# Patient Record
Sex: Female | Born: 1965 | Race: Black or African American | Hispanic: No | Marital: Single | Smoking: Never smoker
Health system: Southern US, Community
[De-identification: ages and names within clinical notes are randomized; demographics above are authoritative.]

## PROBLEM LIST (undated history)

## (undated) HISTORY — PX: ABDOMINAL SURGERY: SHX537

## (undated) HISTORY — PX: ABDOMINAL HYSTERECTOMY: SHX81

---

## 2005-01-07 ENCOUNTER — Emergency Department (HOSPITAL_COMMUNITY): Admission: EM | Admit: 2005-01-07 | Discharge: 2005-01-07 | Payer: Self-pay | Admitting: Emergency Medicine

## 2018-02-24 ENCOUNTER — Emergency Department: Payer: No Typology Code available for payment source

## 2018-02-24 ENCOUNTER — Other Ambulatory Visit: Payer: Self-pay

## 2018-02-24 ENCOUNTER — Emergency Department
Admission: EM | Admit: 2018-02-24 | Discharge: 2018-02-24 | Disposition: A | Discharge status: Home / Self Care | Payer: No Typology Code available for payment source | Attending: Emergency Medicine | Admitting: Emergency Medicine

## 2018-02-24 ENCOUNTER — Encounter: Payer: Self-pay | Admitting: Emergency Medicine

## 2018-02-24 DIAGNOSIS — Y999 Unspecified external cause status: Secondary | ICD-10-CM | POA: Diagnosis not present

## 2018-02-24 DIAGNOSIS — S0990XA Unspecified injury of head, initial encounter: Secondary | ICD-10-CM | POA: Diagnosis present

## 2018-02-24 DIAGNOSIS — S161XXA Strain of muscle, fascia and tendon at neck level, initial encounter: Secondary | ICD-10-CM | POA: Diagnosis not present

## 2018-02-24 DIAGNOSIS — Y9389 Activity, other specified: Secondary | ICD-10-CM | POA: Insufficient documentation

## 2018-02-24 DIAGNOSIS — Y92410 Unspecified street and highway as the place of occurrence of the external cause: Secondary | ICD-10-CM | POA: Diagnosis not present

## 2018-02-24 DIAGNOSIS — R52 Pain, unspecified: Secondary | ICD-10-CM

## 2018-02-24 LAB — CBC
HCT: 38.1 % (ref 35.0–47.0)
Hemoglobin: 13 g/dL (ref 12.0–16.0)
MCH: 30.2 pg (ref 26.0–34.0)
MCHC: 34.2 g/dL (ref 32.0–36.0)
MCV: 88.3 fL (ref 80.0–100.0)
PLATELETS: 285 10*3/uL (ref 150–440)
RBC: 4.31 MIL/uL (ref 3.80–5.20)
RDW: 13.4 % (ref 11.5–14.5)
WBC: 3.8 10*3/uL (ref 3.6–11.0)

## 2018-02-24 LAB — TROPONIN I: Troponin I: <0.03 ng/mL (ref <0.03)

## 2018-02-24 LAB — BASIC METABOLIC PANEL
Anion gap: 7 (ref 5–15)
BUN: 13 mg/dL (ref 6–20)
CO2: 21 mmol/L — ABNORMAL LOW (ref 22–32)
CREATININE: 0.73 mg/dL (ref 0.44–1.00)
Calcium: 9.3 mg/dL (ref 8.9–10.3)
Chloride: 109 mmol/L (ref 98–111)
GFR calc Af Amer: >60 mL/min (ref >60)
Glucose, Bld: 97 mg/dL (ref 70–99)
Potassium: 4 mmol/L (ref 3.5–5.1)
SODIUM: 137 mmol/L (ref 135–145)

## 2018-02-24 LAB — POCT PREGNANCY, URINE: PREG TEST UR: NEGATIVE

## 2018-02-24 MED ORDER — TRAMADOL HCL 50 MG PO TABS: 50.0000 mg | ORAL_TABLET | Freq: Three times a day (TID) | ORAL | 0 refills | Status: DC | PRN

## 2018-02-24 MED ORDER — ACETAMINOPHEN 500 MG PO TABS
1000.0000 mg | ORAL_TABLET | ORAL | Status: AC
  Administered 2018-02-24: 1000 mg via ORAL
  Filled 2018-02-24: qty 2
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
## 2018-02-24 NOTE — ED Notes (Signed)
[facility]  Pt alert. c-collar in place.

## 2018-03-15 ENCOUNTER — Other Ambulatory Visit: Payer: Self-pay

## 2018-03-15 ENCOUNTER — Emergency Department
Admission: EM | Admit: 2018-03-15 | Discharge: 2018-03-15 | Disposition: A | Discharge status: Home / Self Care | Payer: No Typology Code available for payment source | Attending: Emergency Medicine | Admitting: Emergency Medicine

## 2018-03-15 ENCOUNTER — Encounter: Payer: Self-pay | Admitting: Emergency Medicine

## 2018-03-15 DIAGNOSIS — M7918 Myalgia, other site: Secondary | ICD-10-CM

## 2018-03-15 MED ORDER — MELOXICAM 15 MG PO TABS: 15.0000 mg | ORAL_TABLET | Freq: Every day | ORAL | 2 refills | Status: AC

## 2018-03-15 MED ORDER — BACLOFEN 10 MG PO TABS: 10.0000 mg | ORAL_TABLET | Freq: Every day | ORAL | 1 refills | Status: AC

## 2018-03-15 NOTE — ED Triage Notes (Addendum)
[facility]  Pt states pain remains in neck, pt continues to wear c collar at triage today.
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
## 2018-03-15 NOTE — Discharge Instructions (Addendum)
[facility]  You will need to call and make an appointment.  Take the medication as prescribed.  Apply wet heat for 15 minutes followed by ice for 10 minutes.
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
